# Patient Record
Sex: Male | Born: 1975 | Race: White | Hispanic: No | Marital: Married
Health system: Southern US, Community
[De-identification: ages and names within clinical notes are randomized; demographics above are authoritative.]

---

## 1997-06-13 ENCOUNTER — Emergency Department (HOSPITAL_COMMUNITY): Admission: EM | Admit: 1997-06-13 | Discharge: 1997-06-13 | Payer: Self-pay | Admitting: Emergency Medicine

## 1997-09-28 ENCOUNTER — Emergency Department (HOSPITAL_COMMUNITY): Admission: EM | Admit: 1997-09-28 | Discharge: 1997-09-28 | Payer: Self-pay | Admitting: Emergency Medicine

## 1999-03-04 ENCOUNTER — Emergency Department (HOSPITAL_COMMUNITY): Admission: EM | Admit: 1999-03-04 | Discharge: 1999-03-04 | Payer: Self-pay | Admitting: Emergency Medicine

## 2003-11-10 ENCOUNTER — Emergency Department (HOSPITAL_COMMUNITY): Admission: EM | Admit: 2003-11-10 | Discharge: 2003-11-10 | Payer: Self-pay | Admitting: Emergency Medicine

## 2010-01-10 ENCOUNTER — Emergency Department (HOSPITAL_BASED_OUTPATIENT_CLINIC_OR_DEPARTMENT_OTHER)
Admission: EM | Admit: 2010-01-10 | Discharge: 2010-01-10 | Payer: Self-pay | Source: Home / Self Care | Admitting: Emergency Medicine

## 2011-11-05 ENCOUNTER — Ambulatory Visit
Admission: RE | Admit: 2011-11-05 | Discharge: 2011-11-05 | Disposition: A | Discharge status: Home / Self Care | Payer: No Typology Code available for payment source | Source: Ambulatory Visit | Attending: Family Medicine | Admitting: Family Medicine

## 2011-11-05 ENCOUNTER — Other Ambulatory Visit: Payer: Self-pay | Admitting: Family Medicine

## 2011-11-05 DIAGNOSIS — Z021 Encounter for pre-employment examination: Secondary | ICD-10-CM

## 2013-03-03 IMAGING — CR DG CHEST 1V
1 series · 1 of 1 positions shown · non-contrast
Comparison: 01/02/2010

CLINICAL DATA: Pre employment physical.

CHEST - 1 VIEW

[w chest pa]
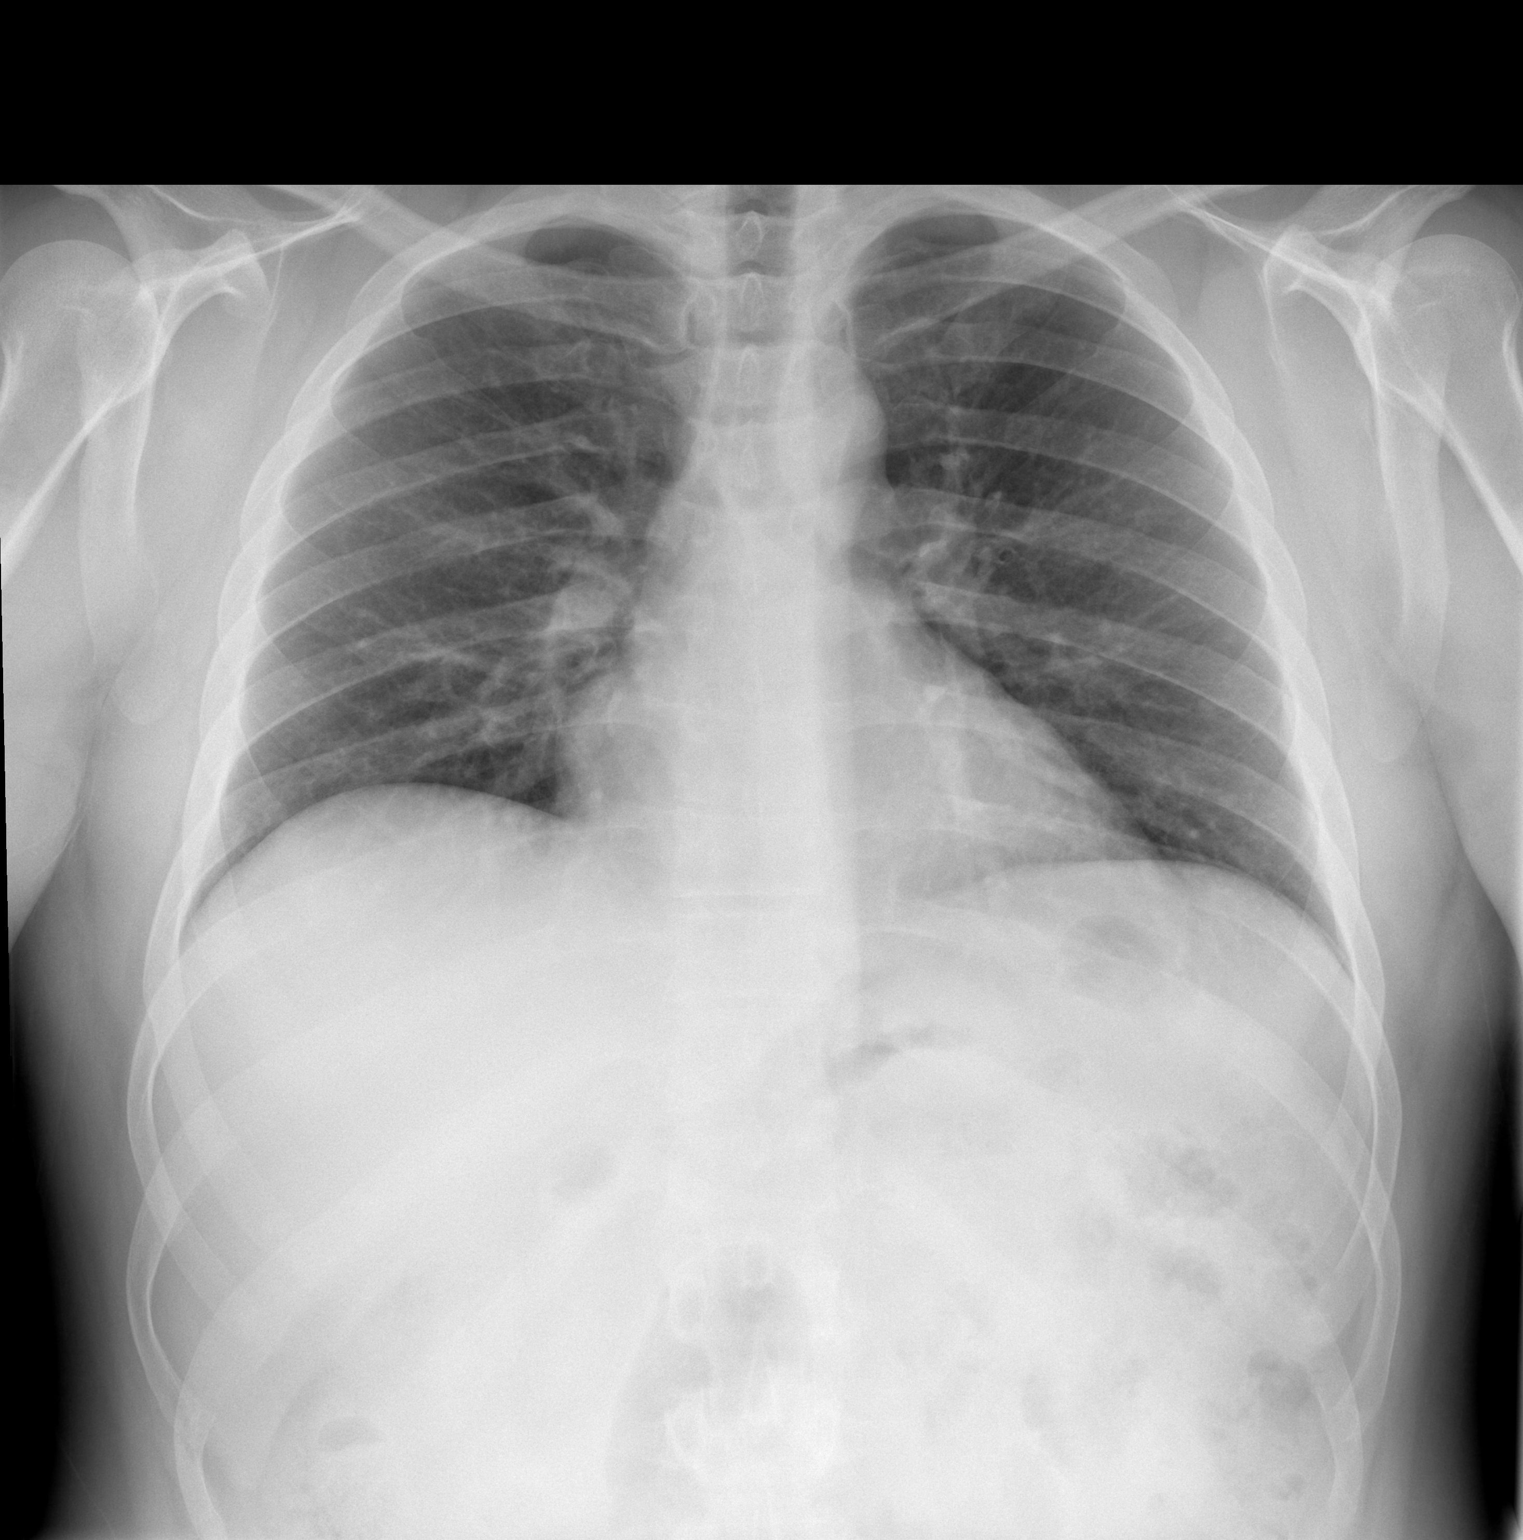

[1 of 1 positions shown; findings below may reference images not displayed]

FINDINGS: Midline trachea.  Normal heart size and mediastinal
contours. No pleural effusion or pneumothorax.  Mildly low lung
volumes. Clear lungs.
IMPRESSION: Low lung volumes without acute disease.

## 2018-09-09 ENCOUNTER — Other Ambulatory Visit: Payer: Self-pay | Admitting: *Deleted

## 2018-09-09 ENCOUNTER — Telehealth: Payer: Self-pay | Admitting: Nurse Practitioner

## 2018-09-09 DIAGNOSIS — J029 Acute pharyngitis, unspecified: Secondary | ICD-10-CM

## 2018-09-09 DIAGNOSIS — J09X2 Influenza due to identified novel influenza A virus with other respiratory manifestations: Secondary | ICD-10-CM

## 2018-09-09 DIAGNOSIS — R509 Fever, unspecified: Secondary | ICD-10-CM

## 2018-09-09 DIAGNOSIS — Z20822 Contact with and (suspected) exposure to covid-19: Secondary | ICD-10-CM

## 2018-09-09 DIAGNOSIS — R52 Pain, unspecified: Secondary | ICD-10-CM

## 2018-09-09 NOTE — Progress Notes (Signed)
E-Visit for Corona Virus Screening   Your current symptoms could be consistent with the coronavirus.  Many health care providers can now test patients at their office but not all are.  [facility] has multiple testing sites. For information on our COVID testing locations and hours go to [email or web address]  Please quarantine yourself while awaiting your test results.  We are enrolling you in our [facility] for [place] . Daily you will receive a questionnaire within the [facility] website. Our COVID 19 response team willl be monitoriing your responses daily.  You can go to one of the  testing sites listed below, while they are opened (see hours). You do not need a doctors order to be tested for covid.You do need to self-isolate until your results return and if positive 14 days from when your symptoms started and until you are 3 days symptom free.   Testing Locations (Monday - Friday, 8 a.m. - 3:30 p.m.) . [place]: [facility], [address], [place], [place]: [place], [place], [place], [place] (entrance off [address])  . [place] [address], [place], [place] (across from [facility])    COVID-19 is a respiratory illness with symptoms that are similar to the flu. Symptoms are typically mild to moderate, but there have been cases of severe illness and death due to the virus. The following symptoms may appear 2-14 days after exposure: . Fever . Cough . Shortness of breath or difficulty breathing . Chills . Repeated shaking with chills . Muscle pain . Headache . Sore throat . New loss of taste or smell . Fatigue . Congestion or runny nose . Nausea or vomiting . Diarrhea  It is vitally important that if you feel that you have an infection such as this virus or any other virus that you stay home and away from places where you may  spread it to others.  You should self-quarantine for 14 days if you have symptoms that could potentially be coronavirus or have been in close contact a with a person diagnosed with COVID-19 within the last 2 weeks. You should avoid contact with people age 35 and older.   You should wear a mask or cloth face covering over your nose and mouth if you must be around other people or animals, including pets (even at home). Try to stay at least 6 feet away from other people. This will protect the people around you.  You can use medication such as delsym or mucinex if cough develops  You may also take acetaminophen (Tylenol) as needed for fever.   Reduce your risk of any infection by using the same precautions used for avoiding the common cold or flu:  Marland Kitchen Wash your hands often with soap and warm water for at least 20 seconds.  If soap and water are not readily available, use an alcohol-based hand sanitizer with at least 60% alcohol.  . If coughing or sneezing, cover your mouth and nose by coughing or sneezing into the elbow areas of your shirt or coat, into a tissue or into your sleeve (not your hands). . Avoid shaking hands with others and consider head nods or verbal greetings only. . Avoid touching your eyes, nose, or mouth with unwashed hands.  . Avoid close contact with people who are sick. . Avoid places or events with large numbers of people in one location, like concerts or sporting events. Marland Kitchen  Carefully consider travel plans you have or are making. . If you are planning any travel outside or inside the [place], visit the CDC's Travelers' Health webpage for the latest health notices. . If you have some symptoms but not all symptoms, continue to monitor at home and seek medical attention if your symptoms worsen. . If you are having a medical emergency, call 911.  HOME CARE . Only take medications as instructed by your medical team. . Drink plenty of fluids and get plenty of rest. . A steam or  ultrasonic humidifier can help if you have congestion.   GET HELP RIGHT AWAY IF YOU HAVE EMERGENCY WARNING SIGNS** FOR COVID-19. If you or someone is showing any of these signs seek emergency medical care immediately. Call 911 or proceed to your closest emergency facility if: . You develop worsening high fever. . Trouble breathing . Bluish lips or face . Persistent pain or pressure in the chest . New confusion . Inability to wake or stay awake . You cough up blood. . Your symptoms become more severe  **This list is not all possible symptoms. Contact your medical provider for any symptoms that are sever or concerning to you.   MAKE SURE YOU   Understand these instructions.  Will watch your condition.  Will get help right away if you are not doing well or get worse.  Your e-visit answers were reviewed by a board certified advanced clinical practitioner to complete your personal care plan.  Depending on the condition, your plan could have included both over the counter or prescription medications.  If there is a problem please reply once you have received a response from your provider.  Your safety is important to [place].  If you have drug allergies check your prescription carefully.    You can use MyChart to ask questions about today's visit, request a non-urgent call back, or ask for a work or school excuse for 24 hours related to this e-Visit. If it has been greater than 24 hours you will need to follow up with your provider, or enter a new e-Visit to address those concerns. You will get an e-mail in the next two days asking about your experience.  I hope that your e-visit has been valuable and will speed your recovery. Thank you for using e-visits.   5-10 minutes spent reviewing and documenting in chart.

## 2018-09-10 LAB — NOVEL CORONAVIRUS, NAA: SARS-CoV-2, NAA: NOT DETECTED
